# Patient Record
Sex: Male | Born: 1985 | Race: White | Hispanic: No | Marital: Single | Smoking: Smoker, current status unknown
Health system: Southern US, Community
[De-identification: ages and names within clinical notes are randomized; demographics above are authoritative.]

---

## 2018-05-16 DIAGNOSIS — F331 Major depressive disorder, recurrent, moderate: Secondary | ICD-10-CM | POA: Insufficient documentation

## 2018-05-16 DIAGNOSIS — F411 Generalized anxiety disorder: Secondary | ICD-10-CM | POA: Insufficient documentation

## 2019-05-20 ENCOUNTER — Other Ambulatory Visit: Payer: Self-pay | Admitting: Family Medicine

## 2019-05-21 ENCOUNTER — Other Ambulatory Visit: Payer: Self-pay | Admitting: Family Medicine

## 2019-05-21 DIAGNOSIS — R519 Headache, unspecified: Secondary | ICD-10-CM

## 2019-06-30 ENCOUNTER — Encounter: Payer: Self-pay | Admitting: Adult Health

## 2019-06-30 ENCOUNTER — Ambulatory Visit (INDEPENDENT_AMBULATORY_CARE_PROVIDER_SITE_OTHER): Payer: Self-pay | Admitting: Adult Health

## 2019-06-30 ENCOUNTER — Other Ambulatory Visit: Payer: Self-pay

## 2019-06-30 VITALS — BP 135/84 | HR 65 | Ht 74.0 in | Wt 209.0 lb

## 2019-06-30 DIAGNOSIS — F429 Obsessive-compulsive disorder, unspecified: Secondary | ICD-10-CM

## 2019-06-30 DIAGNOSIS — G4700 Insomnia, unspecified: Secondary | ICD-10-CM

## 2019-06-30 DIAGNOSIS — F411 Generalized anxiety disorder: Secondary | ICD-10-CM

## 2019-06-30 DIAGNOSIS — F331 Major depressive disorder, recurrent, moderate: Secondary | ICD-10-CM

## 2019-06-30 MED ORDER — ALPRAZOLAM 0.5 MG PO TABS: 0.5000 mg | ORAL_TABLET | Freq: Three times a day (TID) | ORAL | 2 refills | Status: DC | PRN

## 2019-06-30 MED ORDER — SERTRALINE HCL 25 MG PO TABS: ORAL_TABLET | ORAL | 2 refills | Status: DC
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
## 2019-07-28 ENCOUNTER — Other Ambulatory Visit: Payer: Self-pay

## 2019-07-28 ENCOUNTER — Ambulatory Visit (INDEPENDENT_AMBULATORY_CARE_PROVIDER_SITE_OTHER): Payer: Self-pay | Admitting: Adult Health

## 2019-07-28 ENCOUNTER — Encounter: Payer: Self-pay | Admitting: Adult Health

## 2019-07-28 DIAGNOSIS — G4700 Insomnia, unspecified: Secondary | ICD-10-CM

## 2019-07-28 DIAGNOSIS — F411 Generalized anxiety disorder: Secondary | ICD-10-CM

## 2019-07-28 DIAGNOSIS — F331 Major depressive disorder, recurrent, moderate: Secondary | ICD-10-CM

## 2019-07-28 DIAGNOSIS — F429 Obsessive-compulsive disorder, unspecified: Secondary | ICD-10-CM

## 2019-07-28 MED ORDER — ALPRAZOLAM 1 MG PO TABS: 1.0000 mg | ORAL_TABLET | Freq: Two times a day (BID) | ORAL | 2 refills | Status: DC | PRN

## 2019-07-28 NOTE — Progress Notes (Signed)
[facility] MD/PA/NP Initial Note  07/28/2019 11:35 AM Cerrone Debold  MRN:  [id number]  Chief Complaint:  Chief Complaint    Anxiety; Depression; Insomnia; Other      HPI:   Describes mood today as "better". Pleasant. Mood symptoms - reports decreased depression, anxiety, and irritability. Stating "things are going better". Has seen a lot of "improvements". Feels like a lot of things have "resolved". Taking Zoloft 25mg  daily. Can still have "emotions". Feels more "rational". Also has decreased "internal chatter" Is able to free up brain and use it as a resource. Not feeling as "worried". Denies panic attacks. Decreased irritational thoughts - "things are more fact based". Has not felt the need to "clean" constantly.Has been able to drive "easier".  Stating "my thoughts are lighter". Improved interest and motivation. 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 Does not feel lethargic. Stating "being without medications has not been good for me". Taking medications as prescribed.  Energy levels stable. Active, has a regular exercise routine. Enjoys some usual interests and activities. Spending time with family - significant other. Talking with daughter 54 - 40th grader (lives in [place]).  Appetite adequate. Weight stable.   Feels like sleep has gotten better.  Averages 6 to 8 hours. Has decreased "vaping" use. Has started leaving it downstairs. Has seen an improvement in his sleep.  Focus and concentration improved. Completing tasks. Managing aspects of household. Work going well.  Denies SI or HI. Denies AH or VH.  Previous medications: Xanax, multiple SSRI failures, Wellbutrin, Trintellix - low dose maybe helpful - too expensive.   Visit Diagnosis:    ICD-10-CM   1. Generalized anxiety disorder  F41.1   2. Obsessive-compulsive disorder, unspecified type  F42.9   3. Major depressive disorder, recurrent episode, moderate (HCC)  F33.1   4. Insomnia, unspecified type  G47.00 ALPRAZolam (XANAX) 1 MG  tablet    Past Psychiatric History: Hospitalized - 2009 - screening - "panic attacks".  Past Medical History: No past medical history on file.   Family Psychiatric History: Mother - similar symptoms  Family History: No family history on file.  Social History:  Social History   Socioeconomic History  . Marital status: Single    Spouse name: Not on file  . Number of children: Not on file  . Years of education: Not on file  . Highest education level: Not on file  Occupational History  . Not on file  Social Needs  . Financial resource strain: Not on file  . Food insecurity    Worry: Not on file    Inability: Not on file  . Transportation needs    Medical: Not on file    Non-medical: Not on file  Tobacco Use  . Smoking status: Smoker, Current Status Unknown  . Smokeless tobacco: Current User  Substance and Sexual Activity  . Alcohol use: Not on file  . Drug use: Not on file  . Sexual activity: Not on file  Lifestyle  . Physical activity    Days per week: Not on file    Minutes per session: Not on file  . Stress: Not on file  Relationships  . Social Herbalist on phone: Not on file    Gets together: Not on file    Attends religious service: Not on file    Active member of club or organization: Not on file    Attends meetings of clubs or organizations: Not on file    Relationship status: Not on file  Other Topics Concern  . Not on file  Social History Narrative  . Not on file    Allergies: No Known Allergies  Metabolic Disorder Labs: No results found for: HGBA1C, MPG No results found for: PROLACTIN No results found for: CHOL, TRIG, HDL, CHOLHDL, VLDL, LDLCALC No results found for: TSH  Therapeutic Level Labs: No results found for: LITHIUM No results found for: VALPROATE No components found for:  CBMZ  Current Medications: Current Outpatient Medications  Medication Sig Dispense Refill  . ALPRAZolam (XANAX) 1 MG tablet Take 1 tablet (1 mg  total) by mouth 2 (two) times daily as needed for anxiety. Cancel any previous Xanax prescriptions. 60 tablet 2  . sertraline (ZOLOFT) 25 MG tablet Take 1/2 tablet daily x 7 days, then one tablet daily. 30 tablet 2   No current facility-administered medications for this visit.     Medication Side Effects: none  Orders placed this visit:  No orders of the defined types were placed in this encounter.   Psychiatric Specialty Exam:  Review of Systems  Neurological: Negative for tremors and weakness.  Psychiatric/Behavioral: Negative for depression. The patient has insomnia. The patient is not nervous/anxious.     There were no vitals taken for this visit.There is no height or weight on file to calculate BMI.  General Appearance: Neat and Well Groomed  Eye Contact:  Good  Speech:  Clear and Coherent  Volume:  Normal  Mood:  Euthymic  Affect:  Congruent  Thought Process:  Coherent  Orientation:  Full (Time, Place, and Person)  Thought Content: Logical   Suicidal Thoughts:  No  Homicidal Thoughts:  No  Memory:  WNL  Judgement:  Good  Insight:  Good  Psychomotor Activity:  Negative  Concentration:  Concentration: Good  Recall:  Good  Fund of Knowledge: Good  Language: Good  Assets:  Communication Skills Desire for Improvement Financial Resources/Insurance Housing Intimacy Leisure Time Physical Health Resilience Social Support Talents/Skills Transportation Vocational/Educational  ADL's:  Intact  Cognition: WNL  Prognosis:  Good   Screenings:   Receiving Psychotherapy: No   Treatment Plan/Recommendations:   Plan:  1. Increase Xanax 0.5mg  TID to 1mg  BID 2. Continue Zoloft 25mg  daily   RTC 4 weeks  Discussed potential benefits, risk, and side effects of benzodiazepines to include potential risk of tolerance and dependence, as well as possible drowsiness.  Advised patient not to drive if experiencing drowsiness and to take lowest possible effective dose to  minimize risk of dependence and tolerance.  Patient advised to contact office with any questions, adverse effects, or acute worsening in signs and symptoms.    , NP

## 2019-07-31 ENCOUNTER — Telehealth: Payer: Self-pay | Admitting: Adult Health

## 2019-07-31 NOTE — Telephone Encounter (Signed)
Patient called and left a message stating that he had a question to ask Dominic Stewart? Please give him a cal back at [phone]

## 2019-08-01 NOTE — Telephone Encounter (Signed)
Pt. Stated that he will call back on Monday to speak with me. 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

## 2019-08-06 ENCOUNTER — Telehealth: Payer: Self-pay | Admitting: Adult Health

## 2019-08-06 NOTE — Telephone Encounter (Signed)
Dominic Stewart called wanting to discuss the medicine change made at last appt 9/28.  Please call his cell [phone].  Next appt 08/21/19

## 2019-08-07 NOTE — Telephone Encounter (Signed)
Dominic Stewart phoned again stating he mistakenly filled his Xanax before he was seen by you and you did a dosage increase. He states he urgently need to speak with with these concerns before he leaves to visit his daughter out of town.

## 2019-08-07 NOTE — Telephone Encounter (Signed)
Patient filled #[phone] on 07/26/2019

## 2019-08-11 NOTE — Telephone Encounter (Signed)
Dominic Stewart called wanting to update you on his medication(Xanax)change that was discussed last week. He will be leaving to visit with daughter sooner than planned, and wanted to revisit the change and getting a refill before he leaves.  Please call to discuss.  Presently has an appt 08/25/19.

## 2019-08-12 NOTE — Telephone Encounter (Signed)
Discussed with patient that he can pick up his refill on 08/15/2019 per pharmacy. He may have to call back and reschedule office visit on 08/25/2019 due to being out of town. Appreciative of the help

## 2019-08-13 NOTE — Telephone Encounter (Signed)
Pt. Has not called back. When I spoke with him last, he had said he would call back if he felt he needed to. I am going to close out this message.

## 2019-08-21 ENCOUNTER — Other Ambulatory Visit: Payer: Self-pay

## 2019-08-21 ENCOUNTER — Ambulatory Visit (INDEPENDENT_AMBULATORY_CARE_PROVIDER_SITE_OTHER): Payer: Self-pay | Admitting: Adult Health

## 2019-08-21 ENCOUNTER — Encounter (INDEPENDENT_AMBULATORY_CARE_PROVIDER_SITE_OTHER): Payer: Self-pay

## 2019-08-21 ENCOUNTER — Encounter: Payer: Self-pay | Admitting: Adult Health

## 2019-08-21 DIAGNOSIS — G4700 Insomnia, unspecified: Secondary | ICD-10-CM

## 2019-08-21 DIAGNOSIS — F411 Generalized anxiety disorder: Secondary | ICD-10-CM

## 2019-08-21 DIAGNOSIS — F429 Obsessive-compulsive disorder, unspecified: Secondary | ICD-10-CM

## 2019-08-21 DIAGNOSIS — F331 Major depressive disorder, recurrent, moderate: Secondary | ICD-10-CM

## 2019-08-21 MED ORDER — SERTRALINE HCL 50 MG PO TABS: ORAL_TABLET | ORAL | 2 refills | Status: DC

## 2019-08-21 MED ORDER — ALPRAZOLAM 1 MG PO TABS: 1.0000 mg | ORAL_TABLET | Freq: Two times a day (BID) | ORAL | 2 refills | Status: DC | PRN
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
## 2019-08-25 ENCOUNTER — Ambulatory Visit: Payer: Self-pay | Admitting: Adult Health

## 2019-09-15 ENCOUNTER — Other Ambulatory Visit: Payer: Self-pay

## 2019-09-15 ENCOUNTER — Ambulatory Visit (INDEPENDENT_AMBULATORY_CARE_PROVIDER_SITE_OTHER): Payer: Self-pay | Admitting: Adult Health

## 2019-09-15 ENCOUNTER — Encounter: Payer: Self-pay | Admitting: Adult Health

## 2019-09-15 DIAGNOSIS — F331 Major depressive disorder, recurrent, moderate: Secondary | ICD-10-CM

## 2019-09-15 DIAGNOSIS — F411 Generalized anxiety disorder: Secondary | ICD-10-CM

## 2019-09-15 DIAGNOSIS — G4700 Insomnia, unspecified: Secondary | ICD-10-CM

## 2019-09-15 DIAGNOSIS — F429 Obsessive-compulsive disorder, unspecified: Secondary | ICD-10-CM

## 2019-09-15 DIAGNOSIS — F410 Panic disorder [episodic paroxysmal anxiety] without agoraphobia: Secondary | ICD-10-CM

## 2019-09-15 NOTE — Addendum Note (Signed)
Addended by: Aloha Gell on: 09/15/2019 10:23 AM   Modules accepted: Level of Service

## 2019-09-15 NOTE — Progress Notes (Signed)
[facility] MD/PA/NP Medication Check  09/15/2019 10:08 AM Dominic Stewart  MRN:  [id number]  Chief Complaint:  Chief Complaint    Depression; Anxiety; Insomnia; Other      HPI:   Describes mood today as "ok". Pleasant. Mood symptoms - reports decreased depression, anxiety, and irritability - stating I'm still having "all the above", but things are better. Denies panic attacks - although has had periods of increased anxiety. Not as worried about arbitrary things. Stating "medication doesn't feel weird". Not sure if he is "indifferent" or not. Wanting to give the Zoloft at 50mg  one more month. Started using Nicotine at bedtime. Had started leaving Vape downstairs and has now brought it back to the bedroom. Would like to try and leave it downstairs and give the Zoloft another month. He and girlfriend are working on their relationship. Girlfriend made an appointment to have a physical to see if she is having "thyroid" issues. Went to see daughter over Halloween. Stating "we had a blast". Stating "I had a few days of being down after I left her". Stable interest and motivation. Taking medications as prescribed.  Energy levels stable. Active, does not have a regular exercise routine. Working full time. Enjoys some usual interests and activities. Lives with girlfriend and her 68 year old son.Talking with daughter most days - 65 y/o - 5th 5 (lives in [place]).  Appetite adequate. Weight stable.   Difficulties with sleep. Averages 6 hours of broken sleep with "vaping during the night". Using Melatonin and Valerian. Focus and concentration improved. Completing tasks. Managing aspects of household. Work going well.  Denies SI or HI. Denies AH or VH.  Previous medications: Xanax, multiple SSRI failures, Wellbutrin, Trintellix - low dose maybe helpful - too expensive.   Visit Diagnosis:    ICD-10-CM   1. Insomnia, unspecified type  G47.00   2. Major depressive disorder, recurrent episode,  moderate (HCC)  F33.1   3. Generalized anxiety disorder  F41.1   4. Obsessive-compulsive disorder, unspecified type  F42.9   5. Panic attacks  F41.0     Past Psychiatric History: Hospitalized - 2009 - screening - "panic attacks".  Past Medical History: No past medical history on file.   Family Psychiatric History: Mother - similar symptoms  Family History: No family history on file.  Social History:  Social History   Socioeconomic History  . Marital status: Single    Spouse name: Not on file  . Number of children: Not on file  . Years of education: Not on file  . Highest education level: Not on file  Occupational History  . Not on file  Social Needs  . Financial resource strain: Not on file  . Food insecurity    Worry: Not on file    Inability: Not on file  . Transportation needs    Medical: Not on file    Non-medical: Not on file  Tobacco Use  . Smoking status: Smoker, Current Status Unknown  . Smokeless tobacco: Current User  Substance and Sexual Activity  . Alcohol use: Not on file  . Drug use: Not on file  . Sexual activity: Not on file  Lifestyle  . Physical activity    Days per week: Not on file    Minutes per session: Not on file  . Stress: Not on file  Relationships  . Social 2010 on phone: Not on file    Gets together: Not on file    Attends religious service: Not on file  Active member of club or organization: Not on file    Attends meetings of clubs or organizations: Not on file    Relationship status: Not on file  Other Topics Concern  . Not on file  Social History Narrative  . Not on file    Allergies: No Known Allergies  Metabolic Disorder Labs: No results found for: HGBA1C, MPG No results found for: PROLACTIN No results found for: CHOL, TRIG, HDL, CHOLHDL, VLDL, LDLCALC No results found for: TSH  Therapeutic Level Labs: No results found for: LITHIUM No results found for: VALPROATE No components found for:   CBMZ  Current Medications: Current Outpatient Medications  Medication Sig Dispense Refill  . ALPRAZolam (XANAX) 1 MG tablet Take 1 tablet (1 mg total) by mouth 2 (two) times daily as needed for anxiety. Cancel any previous Xanax prescriptions. 60 tablet 2  . sertraline (ZOLOFT) 50 MG tablet Take one tablet daily. 30 tablet 2   No current facility-administered medications for this visit.     Medication Side Effects: none  Orders placed this visit:  No orders of the defined types were placed in this encounter.   Psychiatric Specialty Exam:  Review of Systems  Neurological: Negative for tremors and weakness.  Psychiatric/Behavioral: Positive for depression. The patient has insomnia. The patient is not nervous/anxious.     There were no vitals taken for this visit.There is no height or weight on file to calculate BMI.  General Appearance: Neat and Well Groomed  Eye Contact:  Good  Speech:  Clear and Coherent  Volume:  Normal  Mood:  Euthymic  Affect:  Congruent  Thought Process:  Coherent  Orientation:  Full (Time, Place, and Person)  Thought Content: Logical   Suicidal Thoughts:  No  Homicidal Thoughts:  No  Memory:  WNL  Judgement:  Good  Insight:  Good  Psychomotor Activity:  Negative  Concentration:  Concentration: Good  Recall:  Good  Fund of Knowledge: Good  Language: Good  Assets:  Communication Skills Desire for Improvement Financial Resources/Insurance Housing Intimacy Leisure Time Physical Health Resilience Social Support Talents/Skills Transportation Vocational/Educational  ADL's:  Intact  Cognition: WNL  Prognosis:  Good   Screenings:   Receiving Psychotherapy: No   Treatment Plan/Recommendations:   Plan:  1. Continue Xanax 1mg  BID 2. Zoloft 50mg  daily   RTC 4 weeks  Discussed potential benefits, risk, and side effects of benzodiazepines to include potential risk of tolerance and dependence, as well as possible drowsiness.  Advised  patient not to drive if experiencing drowsiness and to take lowest possible effective dose to minimize risk of dependence and tolerance.  Patient advised to contact office with any questions, adverse effects, or acute worsening in signs and symptoms.    Aloha Gell, NP

## 2019-10-02 ENCOUNTER — Emergency Department (HOSPITAL_COMMUNITY): Payer: Self-pay

## 2019-10-02 ENCOUNTER — Emergency Department (HOSPITAL_COMMUNITY)
Admission: EM | Admit: 2019-10-02 | Discharge: 2019-10-03 | Disposition: A | Discharge status: Home / Self Care | Payer: Self-pay | Attending: Emergency Medicine | Admitting: Emergency Medicine

## 2019-10-02 ENCOUNTER — Encounter: Payer: Self-pay | Admitting: Adult Health

## 2019-10-02 ENCOUNTER — Ambulatory Visit (INDEPENDENT_AMBULATORY_CARE_PROVIDER_SITE_OTHER): Payer: Self-pay | Admitting: Adult Health

## 2019-10-02 ENCOUNTER — Other Ambulatory Visit: Payer: Self-pay

## 2019-10-02 DIAGNOSIS — F410 Panic disorder [episodic paroxysmal anxiety] without agoraphobia: Secondary | ICD-10-CM

## 2019-10-02 DIAGNOSIS — F429 Obsessive-compulsive disorder, unspecified: Secondary | ICD-10-CM

## 2019-10-02 DIAGNOSIS — Z79899 Other long term (current) drug therapy: Secondary | ICD-10-CM | POA: Insufficient documentation

## 2019-10-02 DIAGNOSIS — F411 Generalized anxiety disorder: Secondary | ICD-10-CM

## 2019-10-02 DIAGNOSIS — G4700 Insomnia, unspecified: Secondary | ICD-10-CM

## 2019-10-02 DIAGNOSIS — F17290 Nicotine dependence, other tobacco product, uncomplicated: Secondary | ICD-10-CM | POA: Insufficient documentation

## 2019-10-02 DIAGNOSIS — F331 Major depressive disorder, recurrent, moderate: Secondary | ICD-10-CM

## 2019-10-02 DIAGNOSIS — W108XXA Fall (on) (from) other stairs and steps, initial encounter: Secondary | ICD-10-CM | POA: Insufficient documentation

## 2019-10-02 DIAGNOSIS — M25571 Pain in right ankle and joints of right foot: Secondary | ICD-10-CM | POA: Insufficient documentation

## 2019-10-02 MED ORDER — KETOROLAC TROMETHAMINE 60 MG/2ML IM SOLN
30.0000 mg | Freq: Once | INTRAMUSCULAR | Status: AC
  Administered 2019-10-03: 30 mg via INTRAMUSCULAR
  Filled 2019-10-02: qty 2

## 2019-10-02 NOTE — Discharge Instructions (Signed)
Your x-ray did not show evidence of fracture/broken bone, dislocation.  Apply ice to your ankle 3-4 times per day for 15 to 20 minutes each time.  Take 600 mg ibuprofen every 6 hours for management of ongoing pain.  Keep your leg and ankle elevated.  We recommend a brace for stability.  Use crutches to prevent from putting weight on your right leg.  Follow-up with sports medicine for any ongoing symptoms.

## 2019-10-02 NOTE — ED Triage Notes (Signed)
Pt c/o right ankle and hip pain after falling and sliding down 15 stairs after rolling his ankle on the top step. Pt states he is unable to bear weight on right on his right ankle. Pt states he believes the hip pain is from sliding down the stairs.

## 2019-10-02 NOTE — Progress Notes (Signed)
[facility] MD/PA/NP Medication Check  10/02/2019 5:16 PM Dominic Stewart  MRN:  [id number]  Chief Complaint:    HPI:   Describes mood today as "not good". Pleasant. Mood symptoms - reports depression, anxiety, and irritability. Not doing well. Having panic attacks. Has let "things get the best of him". Gets "like this" during the holidays. Feels "overloaded". Things at home "stressful". Missing daughter in [place]. Relationship "falling" apart. Got a little "apathetic" on the Zoloft at 50mg  and started taking the 25mg  daily - started having "brain zaps". Started relying more on the Xanax and took it "moderately". Wanting to "start over" and get a new plan. Worried about withdrawal and side effects. Has been overtaking Xanax and has been picking up refills early - (every 2 weeks). Stable interest and motivation. Taking medications as prescribed.  Energy levels stable. Active, does not have a regular exercise routine. Works full time. Does not enjoy usual interests and activities. 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  Appetite adequate. Weight stable.   Sleeps better some nights than others. Averages 6 hours of broken sleep - "things are screwed up again". Uses Melatonin and Valerian as needed.  Focus and concentration mostly. Completing tasks. Managing aspects of household. Work going well.  Denies SI or HI. Denies AH or VH.  Previous medications: Xanax, multiple SSRI failures, Wellbutrin, Trintellix - low dose maybe helpful - too expensive.   Visit Diagnosis:  No diagnosis found.  Past Psychiatric History: Hospitalized - 2009 - screening - "panic attacks".  Past Medical History: No past medical history on file.   Family Psychiatric History: Mother - similar symptoms  Family History: No family history on file.  Social History:  Social History   Socioeconomic History  . Marital status:  Single    Spouse name: Not on file  . Number of children: Not on file  . Years of education: Not on file  . Highest education level: Not on file  Occupational History  . Not on file  Social Needs  . Financial resource strain: Not on file  . Food insecurity    Worry: Not on file    Inability: Not on file  . Transportation needs    Medical: Not on file    Non-medical: Not on file  Tobacco Use  . Smoking status: Smoker, Current Status Unknown  . Smokeless tobacco: Current User  Substance and Sexual Activity  . Alcohol use: Not on file  . Drug use: Not on file  . Sexual activity: Not on file  Lifestyle  . Physical activity    Days per week: Not on file    Minutes per session: Not on file  . Stress: Not on file  Relationships  . Social Herbalist on phone: Not on file    Gets together: Not on file    Attends religious service: Not on file    Active member of club or organization: Not on file    Attends meetings of clubs or organizations: Not on file    Relationship status: Not on file  Other Topics Concern  . Not on file  Social History Narrative  . Not on file    Allergies: No Known Allergies  Metabolic Disorder Labs: No results found for: HGBA1C, MPG No results found for: PROLACTIN No results found for: CHOL, TRIG, HDL, CHOLHDL, VLDL, LDLCALC No results found for: TSH  Therapeutic Level Labs: No results found for: LITHIUM No results found for: VALPROATE No components found for:  CBMZ  Current Medications: Current Outpatient Medications  Medication Sig Dispense Refill  . ALPRAZolam (XANAX) 1 MG tablet Take 1 tablet (1 mg total) by mouth 2 (two) times daily as needed for anxiety. Cancel any previous Xanax prescriptions. 60 tablet 2  . sertraline (ZOLOFT) 50 MG tablet Take one tablet daily. 30 tablet 2   No current facility-administered medications for this visit.     Medication Side Effects: none  Orders placed this visit:  No orders of the  defined types were placed in this encounter.   Psychiatric Specialty Exam:  Review of Systems  Musculoskeletal: Negative for falls.  Neurological: Negative for tremors and weakness.  Psychiatric/Behavioral: Positive for depression. The patient is nervous/anxious and has insomnia.     There were no vitals taken for this visit.There is no height or weight on file to calculate BMI.  General Appearance: Neat and Well Groomed  Eye Contact:  Good  Speech:  Clear and Coherent  Volume:  Normal  Mood:  Euthymic  Affect:  Congruent  Thought Process:  Coherent and Descriptions of Associations: Intact  Orientation:  Full (Time, Place, and Person)  Thought Content: Logical   Suicidal Thoughts:  No  Homicidal Thoughts:  No  Memory:  WNL  Judgement:  Good  Insight:  Good  Psychomotor Activity:  Negative  Concentration:  Concentration: Good  Recall:  Good  Fund of Knowledge: Good  Language: Good  Assets:  Communication Skills Desire for Improvement Financial Resources/Insurance Housing Intimacy Leisure Time Physical Health Resilience Social Support Talents/Skills Transportation Vocational/Educational  ADL's:  Intact  Cognition: WNL  Prognosis:  Good   Screenings: None  Receiving Psychotherapy: No   Treatment Plan/Recommendations:   Plan:  1. Discontinue Xanax 1mg  BID - overtaking and has early pick ups - referred to ED for Benzodiazepine detox - patient refusing to go due to work - asking for a in house taper.   2. Continue Zoloft 25mg  daily   RTC 4 weeks  Discussed potential benefits, risk, and side effects of benzodiazepines to include potential risk of tolerance and dependence, as well as possible drowsiness.  Advised patient not to drive if experiencing drowsiness and to take lowest possible effective dose to minimize risk of dependence and tolerance.  Patient advised to contact office with any questions, adverse effects, or acute worsening in signs and  symptoms.    , NP

## 2019-10-03 ENCOUNTER — Telehealth: Payer: Self-pay

## 2019-10-03 MED ORDER — GABAPENTIN 300 MG PO CAPS: 300.0000 mg | ORAL_CAPSULE | Freq: Three times a day (TID) | ORAL | 1 refills | Status: DC

## 2019-10-03 NOTE — Telephone Encounter (Signed)
Called patient to explain that Dominic Stewart is unable to continue his Xanax Rx due to overtaking. Informed him a Rx for Gabapentin 300 mg tid would be submitted to his [facility] to help with withdrawals and seizures. He agreed and was very apologetic. He also mentioned he may need something for sleep since he used Xanax at hs. Advised him would let Dominic Stewart know but for today we would send this in and if tolerating okay we can increase his dose. Instructed to follow up next week.

## 2019-10-03 NOTE — ED Provider Notes (Signed)
[facility]-EMERGENCY DEPT Provider Note   CSN: [id number] Arrival date & time: 10/02/19  2213     History   Chief Complaint Chief Complaint  Patient presents with  . Ankle Pain  . Hip Pain    HPI Dominic Stewart is a 33 y.o. male.     The history is provided by the patient. No language interpreter was used.  Ankle Pain Location:  Ankle Time since incident: a few hours ago. Injury: yes   Mechanism of injury: fall   Fall:    Fall occurred:  Down stairs   Entrapped after fall: no   Ankle location:  R ankle Pain details:    Quality:  Throbbing   Radiates to:  Does not radiate   Severity:  Moderate   Onset quality:  Sudden   Timing:  Constant   Progression:  Worsening Chronicity:  New Dislocation: no   Prior injury to area:  No Relieved by:  Nothing Worsened by:  Bearing weight Ineffective treatments:  Ice Associated symptoms: swelling   Associated symptoms: no decreased ROM, no fever and no numbness   Risk factors: no frequent fractures and no obesity   Hip Pain    No past medical history on file.  Patient Active Problem List   Diagnosis Date Noted  . GAD (generalized anxiety disorder) 05/16/2018  . Major depressive disorder, recurrent, moderate (HCC) 05/16/2018    No past surgical history on file.     Home Medications    Prior to Admission medications   Medication Sig Start Date End Date Taking? Authorizing Provider  ALPRAZolam Prudy Feeler) 1 MG tablet Take 1 tablet (1 mg total) by mouth 2 (two) times daily as needed for anxiety. Cancel any previous Xanax prescriptions. 08/21/19   Mozingo, Thereasa Solo, NP  sertraline (ZOLOFT) 50 MG tablet Take one tablet daily. 08/21/19   Mozingo, Thereasa Solo, NP    Family History No family history on file.  Social History Social History   Tobacco Use  . Smoking status: Smoker, Current Status Unknown  . Smokeless tobacco: Current User  Substance Use Topics  . Alcohol use: Not on file   . Drug use: Not on file     Allergies   Patient has no known allergies.   Review of Systems Review of Systems  Constitutional: Negative for fever.   Ten systems reviewed and are negative for acute change, except as noted in the HPI.    Physical Exam Updated Vital Signs BP 136/80   Pulse (!) 58   Temp 98 F (36.7 C)   Resp 16   SpO2 99%   Physical Exam Vitals signs and nursing note reviewed.  Constitutional:      General: He is not in acute distress.    Appearance: He is well-developed. He is not diaphoretic.     Comments: Nontoxic appearing and in NAD  HENT:     Head: Normocephalic and atraumatic.  Eyes:     General: No scleral icterus.    Conjunctiva/sclera: Conjunctivae normal.  Neck:     Musculoskeletal: Normal range of motion.  Cardiovascular:     Rate and Rhythm: Normal rate and regular rhythm.     Pulses: Normal pulses.     Comments: DP pulse 2+ in the right lower extremity Pulmonary:     Effort: Pulmonary effort is normal. No respiratory distress.  Musculoskeletal: Normal range of motion.     Right hip: Normal.     Comments: Mild swelling to the right  ankle without crepitus or deformity.  As to the lateral malleolus of the right ankle.  Negative Thompson test.  Patient able to wiggle all toes of the right foot.  AROM of the right ankle is largely preserved.  No hematoma, contusion, abrasion to the right flank or low back.  Skin:    General: Skin is warm and dry.     Coloration: Skin is not pale.     Findings: No erythema or rash.  Neurological:     Mental Status: He is alert and oriented to person, place, and time.     Coordination: Coordination normal.     Comments: Sensation to light touch intact  Psychiatric:        Behavior: Behavior normal.      ED Treatments / Results  Labs (all labs ordered are listed, but only abnormal results are displayed) Labs Reviewed - No data to display  EKG None  Radiology Dg Ankle Complete Right  Result  Date: 10/02/2019 CLINICAL DATA:  34 year old male with fall and trauma to the right ankle. EXAM: RIGHT ANKLE - COMPLETE 3+ VIEW COMPARISON:  None. FINDINGS: There is no evidence of fracture, dislocation, or joint effusion. There is no evidence of arthropathy or other focal bone abnormality. Soft tissues are unremarkable. IMPRESSION: Negative. Electronically Signed   By: Anner Crete M.D.   On: 10/02/2019 22:49    Procedures Procedures (including critical care time)  Medications Ordered in ED Medications  ketorolac (TORADOL) injection 30 mg (30 mg Intramuscular Given 10/03/19 0020)     Initial Impression / Assessment and Plan / ED Course  I have reviewed the triage vital signs and the nursing notes.  Pertinent labs & imaging results that were available during my care of the patient were reviewed by me and considered in my medical decision making (see chart for details).        Patient presents to the emergency department for evaluation of R ankle pain after a fall down stairs. No LOC. Patient neurovascularly intact on exam. Imaging negative for fracture, dislocation, bony deformity. Compartments in the affected extremity are soft. Plan for supportive management including RICE and NSAIDs; primary care follow up as needed. Return precautions discussed and provided. Patient discharged in stable condition with no unaddressed concerns.   Final Clinical Impressions(s) / ED Diagnoses   Final diagnoses:  Acute right ankle pain  Fall down stairs, initial encounter    ED Discharge Orders    None       Antonietta Breach, PA-C 04/54/09 [id number]    Delora Fuel, MD 14/78/29 [phone]

## 2019-10-06 ENCOUNTER — Ambulatory Visit: Payer: Self-pay | Admitting: Adult Health

## 2019-10-10 NOTE — Telephone Encounter (Signed)
Left pt. A VM to return our call when he can.

## 2019-10-13 ENCOUNTER — Ambulatory Visit: Payer: Self-pay | Admitting: Adult Health

## 2019-10-15 NOTE — Telephone Encounter (Signed)
Also something for anxiety to get him through the day. His anxiety had been overwhelming during the day. He feels like it is affecting his job.

## 2019-10-15 NOTE — Telephone Encounter (Signed)
Spoke with pt. And he states it's still a little bit of a challenge but he feels the worst is behind him. He feels more like himself and he is trying different coping skills to manage his thoughts. Sleep is still an issue. Gabapentin made him feel weird so he stopped it. He would like to try something else for sleep. He states hydroxyzine makes him to sleepy and he has a bit of a hangover from it. He would like something that is mild and that he will be able to work without being tired and hung over. Please advise

## 2019-10-16 NOTE — Telephone Encounter (Signed)
He does not have any appt. Scheduled. Would you like me to call him and have him make one?

## 2019-10-16 NOTE — Telephone Encounter (Signed)
Yes, please.

## 2019-10-16 NOTE — Telephone Encounter (Signed)
We can discuss at next appointment.

## 2019-10-22 ENCOUNTER — Other Ambulatory Visit: Payer: Self-pay

## 2019-10-22 ENCOUNTER — Ambulatory Visit (INDEPENDENT_AMBULATORY_CARE_PROVIDER_SITE_OTHER): Payer: Self-pay | Admitting: Adult Health

## 2019-10-22 ENCOUNTER — Encounter: Payer: Self-pay | Admitting: Adult Health

## 2019-10-22 DIAGNOSIS — F331 Major depressive disorder, recurrent, moderate: Secondary | ICD-10-CM

## 2019-10-22 DIAGNOSIS — F411 Generalized anxiety disorder: Secondary | ICD-10-CM

## 2019-10-22 DIAGNOSIS — F410 Panic disorder [episodic paroxysmal anxiety] without agoraphobia: Secondary | ICD-10-CM

## 2019-10-22 DIAGNOSIS — G4700 Insomnia, unspecified: Secondary | ICD-10-CM

## 2019-10-22 DIAGNOSIS — F429 Obsessive-compulsive disorder, unspecified: Secondary | ICD-10-CM

## 2019-10-22 MED ORDER — GABAPENTIN 100 MG PO CAPS: 100.0000 mg | ORAL_CAPSULE | Freq: Three times a day (TID) | ORAL | 2 refills | Status: DC

## 2019-10-22 MED ORDER — BUSPIRONE HCL 5 MG PO TABS: 5.0000 mg | ORAL_TABLET | Freq: Two times a day (BID) | ORAL | 2 refills | Status: DC

## 2019-10-22 MED ORDER — SERTRALINE HCL 50 MG PO TABS: ORAL_TABLET | ORAL | 2 refills | Status: DC

## 2019-10-22 NOTE — Progress Notes (Signed)
Dominic Stewart [id number] 09-Jan-1986 33 y.o.  Subjective:   Patient ID:  Dominic Stewart is a 33 y.o. (DOB 16-Nov-1985) male.  Chief Complaint: No chief complaint on file.   HPI Dominic Stewart presents to the office today for follow-up of depression, anxiety, OCD, panic attacks and insomnia.  Describes mood today as "not the best". Pleasant. Mood symptoms - reports depression, anxiety, and irritability. More anxious overall. Increased panic attacks. Stating "I'm having a difficult time right now". Has been off of Xanax for a month and is "struggling" with the loss of Xanax. Feels like he needs something to help with his "GABA" receptors. Feels "stressed" all the time. Feels "stuck". Stating "I can't go forward and I can't go backwards". Multiple situational stressors. He and girlfriend not doing well. Girlfriend with increased mental health issues. Feels like he is going to have to make a decision about the relationship after the "New Year". Had to "fight" to get daughter up her from [place] over Christmas. 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 Trying to do better at work. Stable interest and motivation. Taking medications as prescribed.  Energy levels stable. Active, does not have a regular exercise routine. Works full time - overtime. Does not enjoy usual interests and activities. Lives with girlfriend and her 25 year old son. 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  Appetite adequate. Weight stable.   Sleeping difficulties. Averages 4 to 5 hours of broken sleep - "I wake up every 2 hours".  Focus and concentration mostly stable. Completing tasks. Managing aspects of household. Work going well.  Denies SI or HI. Denies AH or VH.  Previous medications: Xanax, multiple SSRI failures, Wellbutrin, Trintellix - low dose maybe helpful - too expensive.   Review of Systems:  Review of Systems  Musculoskeletal: Negative  for gait problem.  Neurological: Negative for tremors.  Psychiatric/Behavioral:       Please refer to HPI   Medications: I have reviewed the patient's current medications.  Current Outpatient Medications  Medication Sig Dispense Refill  . busPIRone (BUSPAR) 5 MG tablet Take 1 tablet (5 mg total) by mouth 2 (two) times daily. 60 tablet 2  . gabapentin (NEURONTIN) 100 MG capsule Take 1 capsule (100 mg total) by mouth 3 (three) times daily. 90 capsule 2  . sertraline (ZOLOFT) 50 MG tablet Take one tablet daily. 30 tablet 2   No current facility-administered medications for this visit.    Medication Side Effects: None  Allergies: No Known Allergies  No past medical history on file.  No family history on file.  Social History   Socioeconomic History  . Marital status: Single    Spouse name: Not on file  . Number of children: Not on file  . Years of education: Not on file  . Highest education level: Not on file  Occupational History  . Not on file  Tobacco Use  . Smoking status: Smoker, Current Status Unknown  . Smokeless tobacco: Current User  Substance and Sexual Activity  . Alcohol use: Not on file  . Drug use: Not on file  . Sexual activity: Not on file  Other Topics Concern  . Not on file  Social History Narrative  . Not on file   Social Determinants of Health   Financial Resource Strain:   . Difficulty of Paying Living Expenses: Not on file  Food Insecurity:   . Worried About Running  Out of Food in the Last Year: Not on file  . Ran Out of Food in the Last Year: Not on file  Transportation Needs:   . Lack of Transportation (Medical): Not on file  . Lack of Transportation (Non-Medical): Not on file  Physical Activity:   . Days of Exercise per Week: Not on file  . Minutes of Exercise per Session: Not on file  Stress:   . Feeling of Stress : Not on file  Social Connections:   . Frequency of Communication with Friends and Family: Not on file  . Frequency of  Social Gatherings with Friends and Family: Not on file  . Attends Religious Services: Not on file  . Active Member of Clubs or Organizations: Not on file  . Attends BankerClub or Organization Meetings: Not on file  . Marital Status: Not on file  Intimate Partner Violence:   . Fear of Current or Ex-Partner: Not on file  . Emotionally Abused: Not on file  . Physically Abused: Not on file  . Sexually Abused: Not on file    Past Medical History, Surgical history, Social history, and Family history were reviewed and updated as appropriate.   Please see review of systems for further details on the patient's review from today.   Objective:   Physical Exam:  There were no vitals taken for this visit.  Physical Exam Constitutional:      General: He is not in acute distress.    Appearance: He is well-developed.  Musculoskeletal:        General: No deformity.  Neurological:     Mental Status: He is alert and oriented to person, place, and time.     Coordination: Coordination normal.  Psychiatric:        Attention and Perception: Attention and perception normal. He does not perceive auditory or visual hallucinations.        Mood and Affect: Mood normal. Mood is not anxious or depressed. Affect is not labile, blunt, angry or inappropriate.        Speech: Speech normal.        Behavior: Behavior normal.        Thought Content: Thought content normal. Thought content is not paranoid or delusional. Thought content does not include homicidal or suicidal ideation. Thought content does not include homicidal or suicidal plan.        Cognition and Memory: Cognition and memory normal.        Judgment: Judgment normal.     Comments: Insight intact     Lab Review:  No results found for: NA, K, CL, CO2, GLUCOSE, BUN, CREATININE, CALCIUM, PROT, ALBUMIN, AST, ALT, ALKPHOS, BILITOT, GFRNONAA, GFRAA  No results found for: WBC, RBC, HGB, HCT, PLT, MCV, MCH, MCHC, RDW, LYMPHSABS, MONOABS, EOSABS,  BASOSABS  No results found for: POCLITH, LITHIUM   No results found for: PHENYTOIN, PHENOBARB, VALPROATE, CBMZ   .res Assessment: Plan:    Plan:  1. Continue Zoloft 25mg  daily  2. Buspar 5mg  BID - may increase if tolerating 3. Gabapentin 100mg  TID   RTC 4 weeks  Patient advised to contact office with any questions, adverse effects, or acute worsening in signs and symptoms.  Diagnoses and all orders for this visit:  Insomnia, unspecified type -     gabapentin (NEURONTIN) 100 MG capsule; Take 1 capsule (100 mg total) by mouth 3 (three) times daily.  Major depressive disorder, recurrent episode, moderate (HCC) -     sertraline (ZOLOFT) 50 MG tablet; Take  one tablet daily.  Generalized anxiety disorder -     busPIRone (BUSPAR) 5 MG tablet; Take 1 tablet (5 mg total) by mouth 2 (two) times daily. -     sertraline (ZOLOFT) 50 MG tablet; Take one tablet daily. -     gabapentin (NEURONTIN) 100 MG capsule; Take 1 capsule (100 mg total) by mouth 3 (three) times daily.  Obsessive-compulsive disorder, unspecified type -     sertraline (ZOLOFT) 50 MG tablet; Take one tablet daily.  Panic attacks -     busPIRone (BUSPAR) 5 MG tablet; Take 1 tablet (5 mg total) by mouth 2 (two) times daily.     Please see After Visit Summary for patient specific instructions.  No future appointments.  No orders of the defined types were placed in this encounter.   -------------------------------

## 2019-10-27 ENCOUNTER — Telehealth: Payer: Self-pay | Admitting: Adult Health

## 2019-10-27 NOTE — Telephone Encounter (Signed)
Noted  

## 2019-10-27 NOTE — Telephone Encounter (Signed)
Pt called to report he is seeing some good effects from Wellbutrin and knows it takes time and willing to continue. However, feels he needs a little something to help short term. Talked about how Xanax worked too well and was getting out of hand. Would like to try a more mild med in same class like valium until next visit 1/20. Pharmacy is [facility] on file.

## 2019-10-27 NOTE — Telephone Encounter (Signed)
Please review

## 2019-10-27 NOTE — Telephone Encounter (Signed)
Pt called back to inform to Discontinue Gabapentin. He does not like the way it makes him feel.

## 2019-10-28 NOTE — Telephone Encounter (Signed)
Discussed with patient at last visit. Will not prescribe any controlled substances.

## 2019-10-30 ENCOUNTER — Telehealth: Payer: Self-pay | Admitting: Adult Health

## 2019-10-30 NOTE — Telephone Encounter (Signed)
Left patient voicemail to call back. Patient was requesting Valium according to last message and Rollene Fare will not prescribe him any controlled substances.

## 2019-10-30 NOTE — Telephone Encounter (Signed)
Noted thank you

## 2019-10-30 NOTE — Telephone Encounter (Signed)
Nicolae called back and I relayed the message to him. He wants to be placed on [facility] CX list so I sent him up front to make a closer appt. If possible.

## 2019-10-30 NOTE — Telephone Encounter (Signed)
Dominic Stewart just called to follow up from his phone call 10/27/19.  Please call

## 2019-11-03 ENCOUNTER — Other Ambulatory Visit: Payer: Self-pay

## 2019-11-03 ENCOUNTER — Ambulatory Visit (INDEPENDENT_AMBULATORY_CARE_PROVIDER_SITE_OTHER): Payer: Self-pay | Admitting: Adult Health

## 2019-11-03 ENCOUNTER — Encounter: Payer: Self-pay | Admitting: Adult Health

## 2019-11-03 DIAGNOSIS — G4700 Insomnia, unspecified: Secondary | ICD-10-CM

## 2019-11-03 DIAGNOSIS — F331 Major depressive disorder, recurrent, moderate: Secondary | ICD-10-CM

## 2019-11-03 DIAGNOSIS — F429 Obsessive-compulsive disorder, unspecified: Secondary | ICD-10-CM

## 2019-11-03 DIAGNOSIS — F410 Panic disorder [episodic paroxysmal anxiety] without agoraphobia: Secondary | ICD-10-CM

## 2019-11-03 DIAGNOSIS — F411 Generalized anxiety disorder: Secondary | ICD-10-CM

## 2019-11-03 MED ORDER — TRAZODONE HCL 50 MG PO TABS: 50.0000 mg | ORAL_TABLET | Freq: Every day | ORAL | 2 refills | Status: DC

## 2019-11-03 MED ORDER — SERTRALINE HCL 25 MG PO TABS: ORAL_TABLET | ORAL | 2 refills | Status: DC

## 2019-11-03 MED ORDER — BUSPIRONE HCL 10 MG PO TABS: 10.0000 mg | ORAL_TABLET | Freq: Three times a day (TID) | ORAL | 2 refills | Status: DC

## 2019-11-03 NOTE — Progress Notes (Signed)
Dominic Stewart [id number] [birth year] 34 y.o.  Subjective:   Patient ID:  Dominic Stewart is a 34 y.o. (DOB [birth year]) male.  Chief Complaint:  Chief Complaint  Patient presents with  . Anxiety  . Depression  . Insomnia  . Other    OCD  . Panic Attack    HPI Dominic Stewart presents to the office today for follow-up of depression, anxiety, OCD, panic attacks and insomnia.  Describes mood today as "ok". Pleasant. Mood symptoms - reports depression, anxiety, and irritability. More anxious overall - "it's getting better". Reports panic attacks. Stating "I feel like I'm on to something". Has tolerated the Buspar and feels it has started to work for him "nipping the existential stuff". Daughter visiting from [place] and he feels "whole" when she is here. Concerned about her returning to her home life in [place]. Her mother has agreed to start working on getting her here to see him more often. Does not feel his partner is "helpful" at all. Stating "we are shut off from each other". Stating "I'm tired up putting up with the same old things from her" Still struggles with the "overwhelming" things. Still finds himself "shutting down" at times. Had a situation at work and was able to walk away and "get out of his head". Stating "I'm working on my head space". Stable interest and motivation. Taking medications as prescribed.  Energy levels stable. Active, does not have a regular exercise routine. Works full time - overtime. Does not enjoy usual interests and activities. Lives with girlfriend and her 14 year old son. Daughter visiting from [place].  Appetite adequate. Weight stable.   Sleeping difficulties. Averages 4 to 5 hours of broken sleep - using Benadryl. Stating "sleep has always been an issue for me". Focus and concentration mostly stable. Completing tasks. Managing aspects of household. Work going well.  Denies SI or HI. Denies AH or VH.  Previous medications: Xanax, multiple SSRI  failures, Wellbutrin, Trintellix - low dose maybe helpful - too expensive.   Review of Systems:  Review of Systems  Musculoskeletal: Negative for gait problem.  Neurological: Negative for tremors.  Psychiatric/Behavioral:       Please refer to HPI    Medications: I have reviewed the patient's current medications.  Current Outpatient Medications  Medication Sig Dispense Refill  . busPIRone (BUSPAR) 10 MG tablet Take 1 tablet (10 mg total) by mouth 3 (three) times daily. 90 tablet 2  . sertraline (ZOLOFT) 25 MG tablet Take one tablet daily. 30 tablet 2  . traZODone (DESYREL) 50 MG tablet Take 1 tablet (50 mg total) by mouth at bedtime. 30 tablet 2   No current facility-administered medications for this visit.    Medication Side Effects: None  Allergies: No Known Allergies  No past medical history on file.  No family history on file.  Social History   Socioeconomic History  . Marital status: Single    Spouse name: Not on file  . Number of children: Not on file  . Years of education: Not on file  . Highest education level: Not on file  Occupational History  . Not on file  Tobacco Use  . Smoking status: Smoker, Current Status Unknown  . Smokeless tobacco: Current User  Substance and Sexual Activity  . Alcohol use: Not on file  . Drug use: Not on file  . Sexual activity: Not on file  Other Topics Concern  . Not on file  Social History Narrative  . Not on file  Social Determinants of Health   Financial Resource Strain:   . Difficulty of Paying Living Expenses: Not on file  Food Insecurity:   . Worried About Programme researcher, broadcasting/film/video in the Last Year: Not on file  . Ran Out of Food in the Last Year: Not on file  Transportation Needs:   . Lack of Transportation (Medical): Not on file  . Lack of Transportation (Non-Medical): Not on file  Physical Activity:   . Days of Exercise per Week: Not on file  . Minutes of Exercise per Session: Not on file  Stress:   . Feeling  of Stress : Not on file  Social Connections:   . Frequency of Communication with Friends and Family: Not on file  . Frequency of Social Gatherings with Friends and Family: Not on file  . Attends Religious Services: Not on file  . Active Member of Clubs or Organizations: Not on file  . Attends Banker Meetings: Not on file  . Marital Status: Not on file  Intimate Partner Violence:   . Fear of Current or Ex-Partner: Not on file  . Emotionally Abused: Not on file  . Physically Abused: Not on file  . Sexually Abused: Not on file    Past Medical History, Surgical history, Social history, and Family history were reviewed and updated as appropriate.   Please see review of systems for further details on the patient's review from today.   Objective:   Physical Exam:  There were no vitals taken for this visit.  Physical Exam Constitutional:      General: He is not in acute distress.    Appearance: He is well-developed.  Musculoskeletal:        General: No deformity.  Neurological:     Mental Status: He is alert and oriented to person, place, and time.     Coordination: Coordination normal.  Psychiatric:        Attention and Perception: Attention and perception normal. He does not perceive auditory or visual hallucinations.        Mood and Affect: Mood is anxious and depressed. Affect is not labile, blunt, angry or inappropriate.        Speech: Speech normal.        Behavior: Behavior normal.        Thought Content: Thought content normal. Thought content is not paranoid or delusional. Thought content does not include homicidal or suicidal ideation. Thought content does not include homicidal or suicidal plan.        Cognition and Memory: Cognition and memory normal.        Judgment: Judgment normal.     Comments: Insight intact    Lab Review:  No results found for: NA, K, CL, CO2, GLUCOSE, BUN, CREATININE, CALCIUM, PROT, ALBUMIN, AST, ALT, ALKPHOS, BILITOT, GFRNONAA,  GFRAA  No results found for: WBC, RBC, HGB, HCT, PLT, MCV, MCH, MCHC, RDW, LYMPHSABS, MONOABS, EOSABS, BASOSABS  No results found for: POCLITH, LITHIUM   No results found for: PHENYTOIN, PHENOBARB, VALPROATE, CBMZ   .res Assessment: Plan:   Plan:  1. Continue Zoloft 25mg  daily  2. Buspar 10mg  TID 3. D/C Gabapentin 100mg  TID - did not tolerate 4. Add Trazadone 50mg  - 1/2 to 1 tablet at bedtime  Uses Benadryl as needed for sleep  RTC 4 weeks  Patient advised to contact office with any questions, adverse effects, or acute worsening in signs and symptoms.  Dominic Stewart was seen today for anxiety, depression, insomnia, other and panic  attack.  Diagnoses and all orders for this visit:  Insomnia, unspecified type -     traZODone (DESYREL) 50 MG tablet; Take 1 tablet (50 mg total) by mouth at bedtime.  Major depressive disorder, recurrent episode, moderate (HCC) -     sertraline (ZOLOFT) 25 MG tablet; Take one tablet daily.  Generalized anxiety disorder -     sertraline (ZOLOFT) 25 MG tablet; Take one tablet daily. -     busPIRone (BUSPAR) 10 MG tablet; Take 1 tablet (10 mg total) by mouth 3 (three) times daily.  Obsessive-compulsive disorder, unspecified type -     sertraline (ZOLOFT) 25 MG tablet; Take one tablet daily.  Panic attacks -     busPIRone (BUSPAR) 10 MG tablet; Take 1 tablet (10 mg total) by mouth 3 (three) times daily.     Please see After Visit Summary for patient specific instructions.  No future appointments.  No orders of the defined types were placed in this encounter.   -------------------------------

## 2019-11-19 ENCOUNTER — Ambulatory Visit: Payer: Self-pay | Admitting: Adult Health

## 2019-12-01 ENCOUNTER — Encounter: Payer: Self-pay | Admitting: Adult Health

## 2019-12-01 ENCOUNTER — Ambulatory Visit (INDEPENDENT_AMBULATORY_CARE_PROVIDER_SITE_OTHER): Payer: Self-pay | Admitting: Adult Health

## 2019-12-01 ENCOUNTER — Other Ambulatory Visit: Payer: Self-pay

## 2019-12-01 DIAGNOSIS — F410 Panic disorder [episodic paroxysmal anxiety] without agoraphobia: Secondary | ICD-10-CM

## 2019-12-01 DIAGNOSIS — F429 Obsessive-compulsive disorder, unspecified: Secondary | ICD-10-CM

## 2019-12-01 DIAGNOSIS — G4700 Insomnia, unspecified: Secondary | ICD-10-CM

## 2019-12-01 DIAGNOSIS — F411 Generalized anxiety disorder: Secondary | ICD-10-CM

## 2019-12-01 DIAGNOSIS — F331 Major depressive disorder, recurrent, moderate: Secondary | ICD-10-CM

## 2019-12-01 MED ORDER — QUETIAPINE FUMARATE 25 MG PO TABS: 25.0000 mg | ORAL_TABLET | Freq: Every day | ORAL | 2 refills | Status: DC

## 2019-12-01 NOTE — Progress Notes (Signed)
[name] [id number] 01-Nov-1985 34 y.o.  Subjective:   Patient ID:  Dominic Stewart is a 34 y.o. (DOB [birth year]) male.  Chief Complaint:  Chief Complaint  Patient presents with  . Depression  . Anxiety  . Insomnia  . Panic Attack  . Other    OCD   HPI   Dominic Stewart presents to the office today for follow-up of depression, anxiety, OCD, panic attacks and insomnia.  Describes mood today as "ok". Pleasant. Mood symptoms - reports depression, anxiety, and irritability. Continues to experience more "anxiety" than anything. Feels like Buspar has been helpful - "I forget the middle of the day dose a lot".  Stating "I have a lot going on right now". Has daughter with him from [place]. His "ex" is relocating to the area from [place], [place]. Stating "she has decided to not make things so hard on me, since I'm the one who has to work". Having issues with current relationship. Stating "I've been fantasizing about living on my own". Stable interest and motivation. Taking medications as prescribed.  Energy levels stable. Active, does not have a regular exercise routine. Works full time - overtime. Does not enjoy usual interests and activities. Single. Lives with girlfriend and her 34 year old son. Daughter staying with him temporarily.  Appetite adequate. Weight stable.   Sleeping better some nights than others. Averages 4 to 5 hours of broken sleep - using Benadryl some nights.  Focus and concentration mostly stable. Completing tasks. Managing aspects of household. Work going well.  Denies SI or HI. Denies AH or VH.  Previous medications: Xanax, multiple SSRI failures, Wellbutrin, Trintellix - low dose maybe helpful - too expensive, Gabapentin, Buspar, Trazadone.    Review of Systems:  Review of Systems  Musculoskeletal: Negative for gait problem.  Neurological: Negative for tremors.  Psychiatric/Behavioral:       Please refer to HPI    Medications: I have reviewed the patient's  current medications.  Current Outpatient Medications  Medication Sig Dispense Refill  . busPIRone (BUSPAR) 10 MG tablet Take 1 tablet (10 mg total) by mouth 3 (three) times daily. 90 tablet 2  . sertraline (ZOLOFT) 25 MG tablet Take one tablet daily. 30 tablet 2   No current facility-administered medications for this visit.    Medication Side Effects: None  Allergies: No Known Allergies  No past medical history on file.  No family history on file.  Social History   Socioeconomic History  . Marital status: Single    Spouse name: Not on file  . Number of children: Not on file  . Years of education: Not on file  . Highest education level: Not on file  Occupational History  . Not on file  Tobacco Use  . Smoking status: Smoker, Current Status Unknown  . Smokeless tobacco: Current User  Substance and Sexual Activity  . Alcohol use: Not on file  . Drug use: Not on file  . Sexual activity: Not on file  Other Topics Concern  . Not on file  Social History Narrative  . Not on file   Social Determinants of Health   Financial Resource Strain:   . Difficulty of Paying Living Expenses: Not on file  Food Insecurity:   . Worried About Programme researcher, broadcasting/film/video in the Last Year: Not on file  . Ran Out of Food in the Last Year: Not on file  Transportation Needs:   . Lack of Transportation (Medical): Not on file  . Lack of Transportation (Non-Medical):  Not on file  Physical Activity:   . Days of Exercise per Week: Not on file  . Minutes of Exercise per Session: Not on file  Stress:   . Feeling of Stress : Not on file  Social Connections:   . Frequency of Communication with Friends and Family: Not on file  . Frequency of Social Gatherings with Friends and Family: Not on file  . Attends Religious Services: Not on file  . Active Member of Clubs or Organizations: Not on file  . Attends Archivist Meetings: Not on file  . Marital Status: Not on file  Intimate Partner  Violence:   . Fear of Current or Ex-Partner: Not on file  . Emotionally Abused: Not on file  . Physically Abused: Not on file  . Sexually Abused: Not on file    Past Medical History, Surgical history, Social history, and Family history were reviewed and updated as appropriate.   Please see review of systems for further details on the patient's review from today.   Objective:   Physical Exam:  There were no vitals taken for this visit.  Physical Exam Constitutional:      General: He is not in acute distress.    Appearance: He is well-developed.  Musculoskeletal:        General: No deformity.  Neurological:     Mental Status: He is alert and oriented to person, place, and time.     Coordination: Coordination normal.  Psychiatric:        Attention and Perception: Attention and perception normal. He does not perceive auditory or visual hallucinations.        Mood and Affect: Mood is anxious. Mood is not depressed. Affect is not labile, blunt, angry or inappropriate.        Speech: Speech normal.        Behavior: Behavior normal.        Thought Content: Thought content normal. Thought content is not paranoid or delusional. Thought content does not include homicidal or suicidal ideation. Thought content does not include homicidal or suicidal plan.        Cognition and Memory: Cognition and memory normal.        Judgment: Judgment normal.     Comments: Insight intact     Lab Review:  No results found for: NA, K, CL, CO2, GLUCOSE, BUN, CREATININE, CALCIUM, PROT, ALBUMIN, AST, ALT, ALKPHOS, BILITOT, GFRNONAA, GFRAA  No results found for: WBC, RBC, HGB, HCT, PLT, MCV, MCH, MCHC, RDW, LYMPHSABS, MONOABS, EOSABS, BASOSABS  No results found for: POCLITH, LITHIUM   No results found for: PHENYTOIN, PHENOBARB, VALPROATE, CBMZ   .res Assessment: Plan:    Plan:  Increased Zoloft 25mg  to 50mg  daily  Buspar 10mg  TID  Discontinued Trazadone 50mg  - 1/2 to 1 tablet at bedtime  RTC 4  weeks  Patient advised to contact office with any questions, adverse effects, or acute worsening in signs and symptoms.  [place] was seen today for depression, anxiety, insomnia, panic attack and other.  Diagnoses and all orders for this visit:  Panic attacks  Obsessive-compulsive disorder, unspecified type  Generalized anxiety disorder  Major depressive disorder, recurrent episode, moderate (HCC)  Insomnia, unspecified type     Please see After Visit Summary for patient specific instructions.  No future appointments.  No orders of the defined types were placed in this encounter.   -------------------------------

## 2019-12-29 ENCOUNTER — Ambulatory Visit: Payer: Self-pay | Admitting: Adult Health

## 2020-01-05 ENCOUNTER — Encounter: Payer: Self-pay | Admitting: Adult Health

## 2020-01-05 ENCOUNTER — Ambulatory Visit (INDEPENDENT_AMBULATORY_CARE_PROVIDER_SITE_OTHER): Payer: Self-pay | Admitting: Adult Health

## 2020-01-05 ENCOUNTER — Other Ambulatory Visit: Payer: Self-pay

## 2020-01-05 DIAGNOSIS — F410 Panic disorder [episodic paroxysmal anxiety] without agoraphobia: Secondary | ICD-10-CM

## 2020-01-05 DIAGNOSIS — G4700 Insomnia, unspecified: Secondary | ICD-10-CM

## 2020-01-05 DIAGNOSIS — F411 Generalized anxiety disorder: Secondary | ICD-10-CM

## 2020-01-05 DIAGNOSIS — F429 Obsessive-compulsive disorder, unspecified: Secondary | ICD-10-CM

## 2020-01-05 DIAGNOSIS — F331 Major depressive disorder, recurrent, moderate: Secondary | ICD-10-CM

## 2020-01-05 NOTE — Progress Notes (Signed)
Dominic Stewart [id number] 08/10/1986 34 y.o.  Subjective:   Patient ID:  Dominic Stewart is a 34 y.o. (DOB [birth year]) male.  Chief Complaint:  Chief Complaint  Patient presents with  . Anxiety  . Depression  . Insomnia  . Other    OCD  . Panic Attack    HPI Dominic Stewart presents to the office today for follow-up of depression, anxiety, OCD, panic attacks and insomnia.  Describes mood today as "ok". Flat. Mood symptoms - reports depression, anxiety, and irritability. Stating "I've had a lot of changes lately". Has separated from girlfriend. Looking for a new place to live. Has stopped medications "tapered off slowly". Trying to get daughter in school. Stating "I felt like I had a nervous breakdown the night I tried to get out of the relationship I was in. Living with parents for now. Eating healthy. Having a hard time being there for his daughter "emotionally". Trying to "slow" his mind down. 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 Stating "I need to take care of the things I need to take care of. Dereased interest and motivation. Taking medications as prescribed.  Energy levels decreased. Active, does not have a regular exercise routine. Works full time. Does not enjoy usual interests and activities. Single. Lives with parents. Daughter staying with him. Appetite adequate. Weight stable.   Sleeping better some nights than others. Averages 6 to 8 hours of broken sleep - using Benadryl.  Focus and concentration mostly stable. Completing tasks. Managing aspects of household. Work going well.  Denies SI or HI. Denies AH or VH.  Previous medications: Xanax, multiple SSRI failures, Wellbutrin, Trintellix - low dose maybe helpful - too expensive, Gabapentin, Buspar, Trazadone, Zoloft.    Review of Systems:  Review of Systems  Musculoskeletal: Negative for gait problem.  Neurological: Negative for tremors.  Psychiatric/Behavioral:       Please refer to HPI    Medications:  I have reviewed the patient's current medications.  No current outpatient medications on file.   No current facility-administered medications for this visit.    Medication Side Effects: None  Allergies: No Known Allergies  No past medical history on file.  No family history on file.  Social History   Socioeconomic History  . Marital status: Single    Spouse name: Not on file  . Number of children: Not on file  . Years of education: Not on file  . Highest education level: Not on file  Occupational History  . Not on file  Tobacco Use  . Smoking status: Smoker, Current Status Unknown  . Smokeless tobacco: Current User  Substance and Sexual Activity  . Alcohol use: Not on file  . Drug use: Not on file  . Sexual activity: Not on file  Other Topics Concern  . Not on file  Social History Narrative  . Not on file   Social Determinants of Health   Financial Resource Strain:   . Difficulty of Paying Living Expenses: Not on file  Food Insecurity:   . Worried About Programme researcher, broadcasting/film/video in the Last Year: Not on file  . Ran Out of Food in the Last Year: Not on file  Transportation Needs:   . Lack of Transportation (Medical): Not on file  . Lack of Transportation (Non-Medical): Not on file  Physical Activity:   . Days of Exercise per Week: Not on file  . Minutes of Exercise per Session: Not on file  Stress:   . Feeling of Stress :  Not on file  Social Connections:   . Frequency of Communication with Friends and Family: Not on file  . Frequency of Social Gatherings with Friends and Family: Not on file  . Attends Religious Services: Not on file  . Active Member of Clubs or Organizations: Not on file  . Attends Archivist Meetings: Not on file  . Marital Status: Not on file  Intimate Partner Violence:   . Fear of Current or Ex-Partner: Not on file  . Emotionally Abused: Not on file  . Physically Abused: Not on file  . Sexually Abused: Not on file    Past  Medical History, Surgical history, Social history, and Family history were reviewed and updated as appropriate.   Please see review of systems for further details on the patient's review from today.   Objective:   Physical Exam:  There were no vitals taken for this visit.  Physical Exam Constitutional:      General: He is not in acute distress. Musculoskeletal:        General: No deformity.  Neurological:     Mental Status: He is alert and oriented to person, place, and time.     Coordination: Coordination normal.  Psychiatric:        Attention and Perception: Attention and perception normal. He does not perceive auditory or visual hallucinations.        Mood and Affect: Mood is anxious and depressed. Affect is not labile, blunt, angry or inappropriate.        Speech: Speech normal.        Behavior: Behavior normal.        Thought Content: Thought content normal. Thought content is not paranoid or delusional. Thought content does not include homicidal or suicidal ideation. Thought content does not include homicidal or suicidal plan.        Cognition and Memory: Cognition and memory normal.        Judgment: Judgment normal.     Comments: Insight intact     Lab Review:  No results found for: NA, K, CL, CO2, GLUCOSE, BUN, CREATININE, CALCIUM, PROT, ALBUMIN, AST, ALT, ALKPHOS, BILITOT, GFRNONAA, GFRAA  No results found for: WBC, RBC, HGB, HCT, PLT, MCV, MCH, MCHC, RDW, LYMPHSABS, MONOABS, EOSABS, BASOSABS  No results found for: POCLITH, LITHIUM   No results found for: PHENYTOIN, PHENOBARB, VALPROATE, CBMZ   .res Assessment: Plan:    Plan:  Patient advised he will call if wanting to continue medications - no controlled substances.   RTC 4 weeks  Patient advised to contact office with any questions, adverse effects, or acute worsening in signs and symptoms.  Ahman was seen today for anxiety, depression, insomnia, other and panic attack.  Diagnoses and all orders for  this visit:  Insomnia, unspecified type  Major depressive disorder, recurrent episode, moderate (HCC)  Generalized anxiety disorder  Obsessive-compulsive disorder, unspecified type  Panic attacks     Please see After Visit Summary for patient specific instructions.  No future appointments.  No orders of the defined types were placed in this encounter.   -------------------------------

## 2020-01-19 ENCOUNTER — Telehealth: Payer: Self-pay | Admitting: Adult Health

## 2020-01-19 MED ORDER — FLUOXETINE HCL 10 MG PO CAPS: 10.0000 mg | ORAL_CAPSULE | Freq: Every day | ORAL | 2 refills | Status: AC

## 2020-01-19 NOTE — Telephone Encounter (Signed)
Pt called and has stated he wants to start PROZAC 10 MG as discussed in his last visit with [facility]. He was told to call once he decided if he wanted to start the medication. Please send to pharmacy on file.

## 2020-01-19 NOTE — Telephone Encounter (Signed)
Script sent  

## 2021-07-07 IMAGING — CR DG ANKLE COMPLETE 3+V*R*
3 series · 3 of 3 positions shown · non-contrast
Comparison: None.

CLINICAL DATA: 32-year-old male with fall and trauma to the right
ankle.

EXAM:
RIGHT ANKLE - COMPLETE 3+ VIEW

[x ankle lat right]
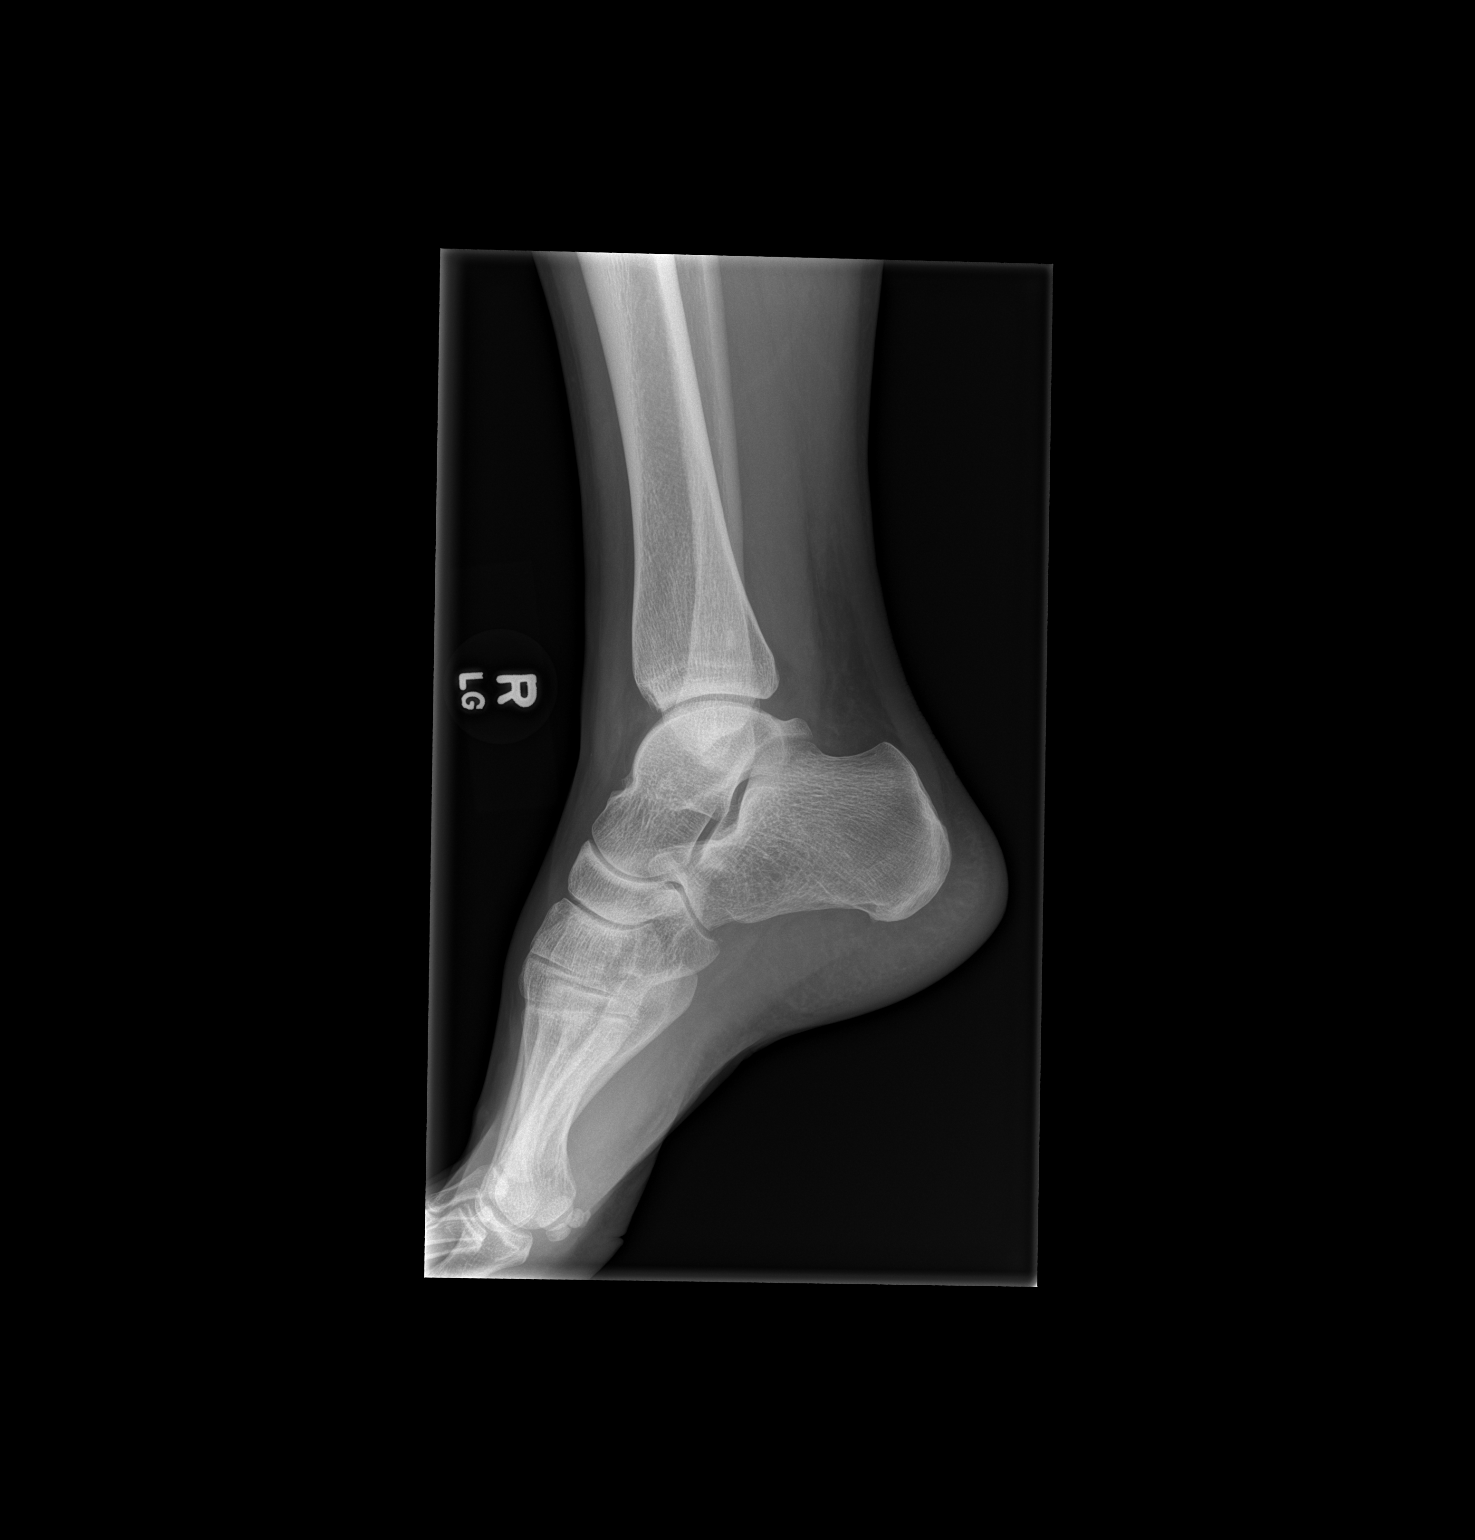

[x ankle ap right]
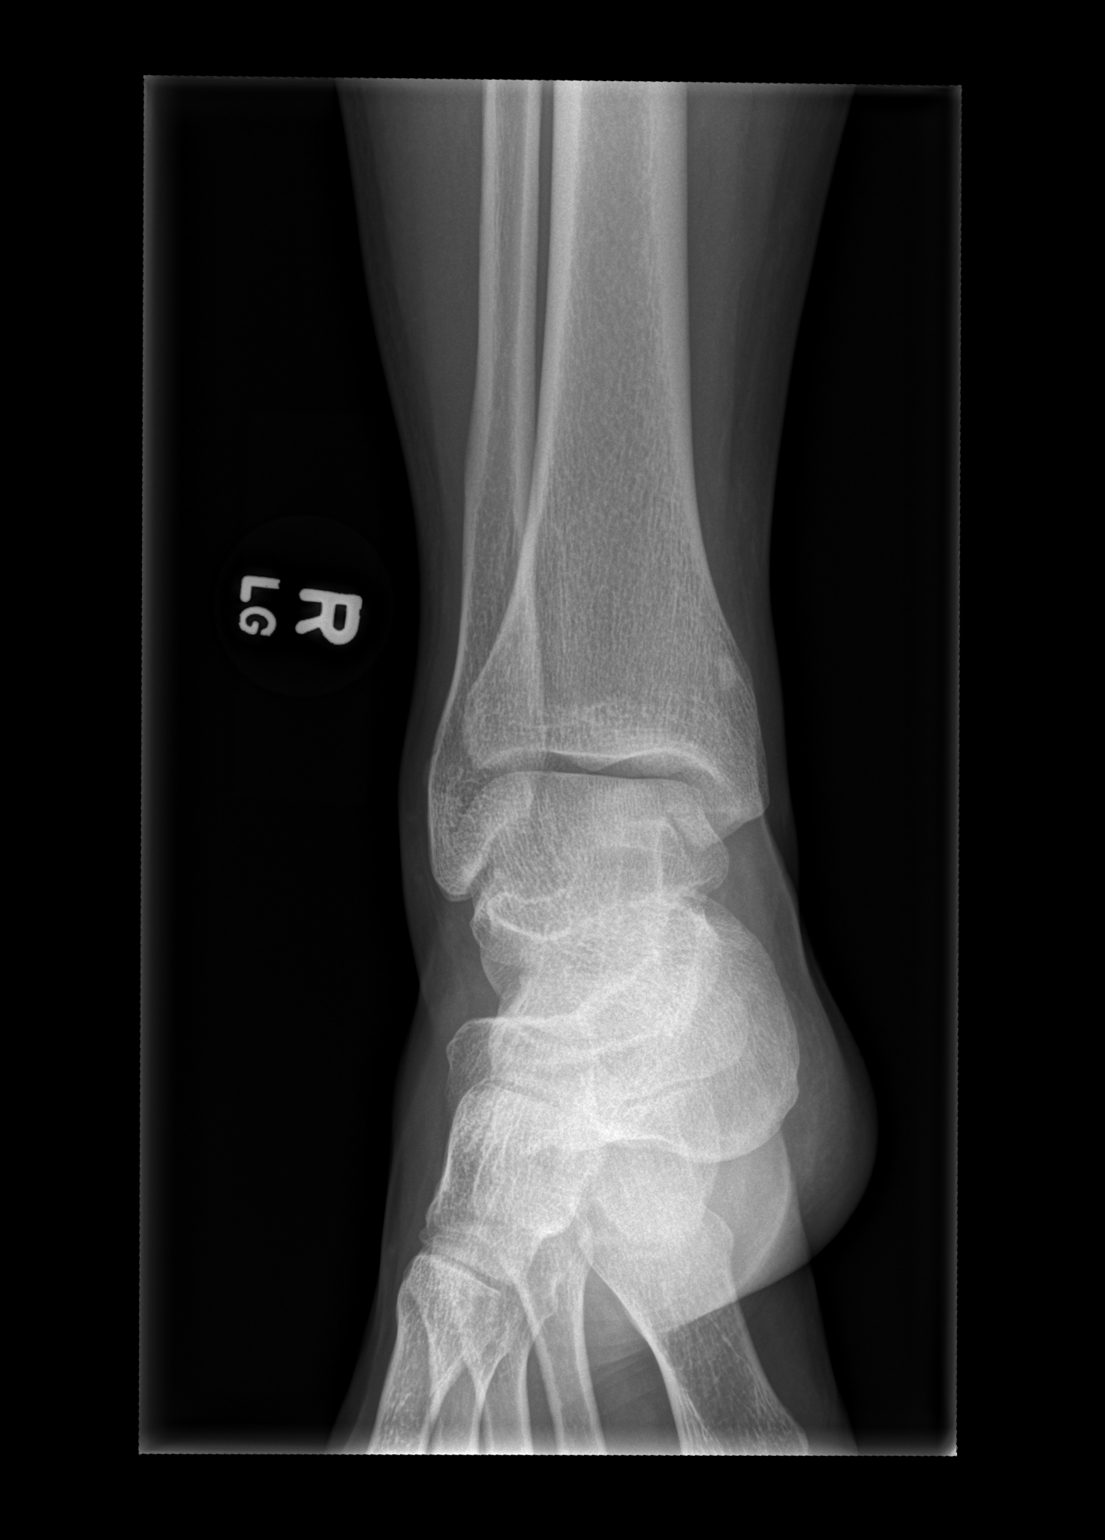

[x ankle obl right]
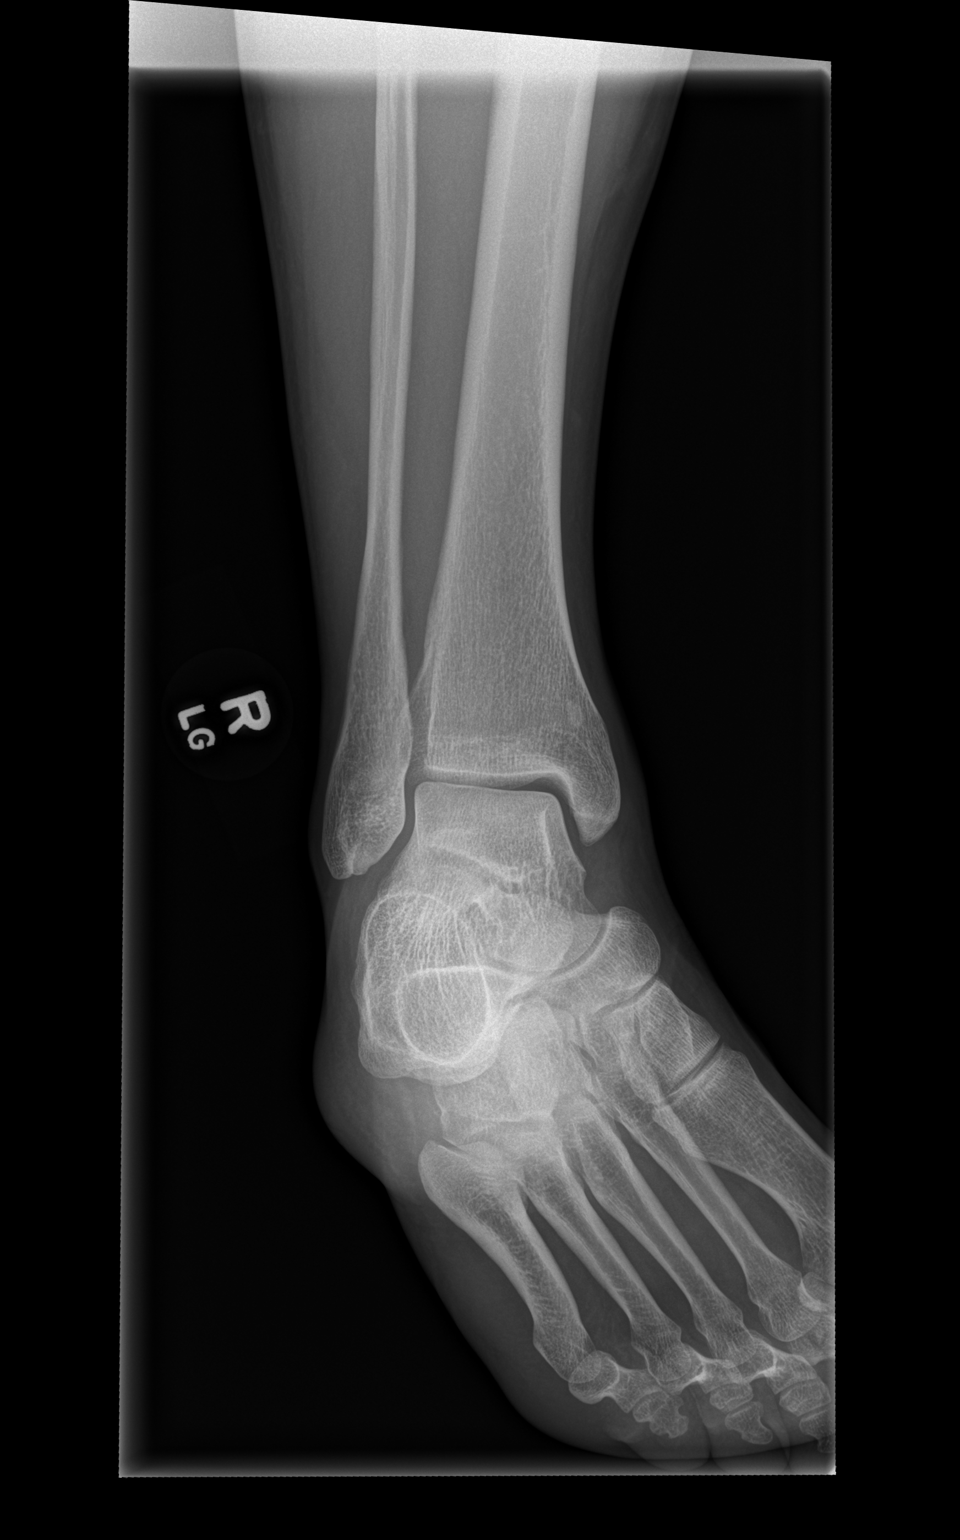

[3 of 3 positions shown; findings below may reference images not displayed]

FINDINGS: There is no evidence of fracture, dislocation, or joint effusion.
There is no evidence of arthropathy or other focal bone abnormality.
Soft tissues are unremarkable.
IMPRESSION: Negative.
